# Patient Record
Sex: Male | Born: 2000 | Hispanic: No | Marital: Single | Smoking: Never smoker
Health system: Southern US, Community
[De-identification: ages and names within clinical notes are randomized; demographics above are authoritative.]

---
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
## 2020-03-08 DIAGNOSIS — J4520 Mild intermittent asthma, uncomplicated: Secondary | ICD-10-CM | POA: Diagnosis not present

## 2020-03-08 DIAGNOSIS — J309 Allergic rhinitis, unspecified: Secondary | ICD-10-CM | POA: Diagnosis not present

## 2020-03-08 DIAGNOSIS — R42 Dizziness and giddiness: Secondary | ICD-10-CM | POA: Diagnosis not present

## 2020-04-01 DIAGNOSIS — R42 Dizziness and giddiness: Secondary | ICD-10-CM | POA: Diagnosis not present

## 2020-04-02 ENCOUNTER — Other Ambulatory Visit: Payer: Self-pay | Admitting: Otolaryngology

## 2020-04-02 DIAGNOSIS — R42 Dizziness and giddiness: Secondary | ICD-10-CM

## 2020-10-01 DIAGNOSIS — M62830 Muscle spasm of back: Secondary | ICD-10-CM | POA: Diagnosis not present

## 2020-10-01 DIAGNOSIS — M25532 Pain in left wrist: Secondary | ICD-10-CM | POA: Diagnosis not present

## 2020-10-15 DIAGNOSIS — S60222A Contusion of left hand, initial encounter: Secondary | ICD-10-CM | POA: Diagnosis not present

## 2020-10-15 DIAGNOSIS — M25532 Pain in left wrist: Secondary | ICD-10-CM | POA: Diagnosis not present

## 2022-04-30 ENCOUNTER — Encounter: Payer: Self-pay | Admitting: Family Medicine

## 2022-04-30 ENCOUNTER — Ambulatory Visit (INDEPENDENT_AMBULATORY_CARE_PROVIDER_SITE_OTHER): Payer: 59 | Admitting: Family Medicine

## 2022-04-30 VITALS — BP 116/78 | HR 72 | Temp 97.0°F | Ht 69.0 in | Wt 211.0 lb

## 2022-04-30 DIAGNOSIS — Z0000 Encounter for general adult medical examination without abnormal findings: Secondary | ICD-10-CM | POA: Diagnosis not present

## 2022-04-30 DIAGNOSIS — Z5320 Procedure and treatment not carried out because of patient's decision for unspecified reasons: Secondary | ICD-10-CM

## 2022-04-30 DIAGNOSIS — Z131 Encounter for screening for diabetes mellitus: Secondary | ICD-10-CM

## 2022-04-30 DIAGNOSIS — Z13220 Encounter for screening for lipoid disorders: Secondary | ICD-10-CM | POA: Diagnosis not present

## 2022-04-30 DIAGNOSIS — Z6831 Body mass index (BMI) 31.0-31.9, adult: Secondary | ICD-10-CM | POA: Diagnosis not present

## 2022-04-30 DIAGNOSIS — Z2821 Immunization not carried out because of patient refusal: Secondary | ICD-10-CM

## 2022-04-30 DIAGNOSIS — R42 Dizziness and giddiness: Secondary | ICD-10-CM | POA: Diagnosis not present

## 2022-04-30 LAB — LIPID PANEL
Cholesterol: 142 mg/dL (ref 0–200)
HDL: 54.2 mg/dL (ref >39.00)
LDL Cholesterol: 62 mg/dL (ref 0–99)
NonHDL: 87.38
Total CHOL/HDL Ratio: 3
Triglycerides: 125 mg/dL (ref 0.0–149.0)
VLDL: 25 mg/dL (ref 0.0–40.0)

## 2022-04-30 LAB — COMPREHENSIVE METABOLIC PANEL
ALT: 31 U/L (ref 0–53)
AST: 25 U/L (ref 0–37)
Albumin: 4.7 g/dL (ref 3.5–5.2)
Alkaline Phosphatase: 77 U/L (ref 39–117)
BUN: 12 mg/dL (ref 6–23)
CO2: 31 mEq/L (ref 19–32)
Calcium: 9.8 mg/dL (ref 8.4–10.5)
Chloride: 102 mEq/L (ref 96–112)
Creatinine, Ser: 0.8 mg/dL (ref 0.40–1.50)
GFR: 126.7 mL/min (ref >60.00)
Glucose, Bld: 118 mg/dL — ABNORMAL HIGH (ref 70–99)
Potassium: 3.6 mEq/L (ref 3.5–5.1)
Sodium: 139 mEq/L (ref 135–145)
Total Bilirubin: 1 mg/dL (ref 0.2–1.2)
Total Protein: 7.5 g/dL (ref 6.0–8.3)

## 2022-04-30 LAB — CBC WITH DIFFERENTIAL/PLATELET
Basophils Absolute: 0 10*3/uL (ref 0.0–0.1)
Basophils Relative: 0.5 % (ref 0.0–3.0)
Eosinophils Absolute: 0.4 10*3/uL (ref 0.0–0.7)
Eosinophils Relative: 5.5 % — ABNORMAL HIGH (ref 0.0–5.0)
HCT: 45.4 % (ref 39.0–52.0)
Hemoglobin: 15.2 g/dL (ref 13.0–17.0)
Lymphocytes Relative: 30 % (ref 12.0–46.0)
Lymphs Abs: 2.2 10*3/uL (ref 0.7–4.0)
MCHC: 33.5 g/dL (ref 30.0–36.0)
MCV: 88.2 fl (ref 78.0–100.0)
Monocytes Absolute: 0.3 10*3/uL (ref 0.1–1.0)
Monocytes Relative: 4.5 % (ref 3.0–12.0)
Neutro Abs: 4.4 10*3/uL (ref 1.4–7.7)
Neutrophils Relative %: 59.5 % (ref 43.0–77.0)
Platelets: 265 10*3/uL (ref 150.0–400.0)
RBC: 5.15 Mil/uL (ref 4.22–5.81)
RDW: 13.1 % (ref 11.5–15.5)
WBC: 7.3 10*3/uL (ref 4.0–10.5)

## 2022-04-30 LAB — HEMOGLOBIN A1C: Hgb A1c MFr Bld: 5.3 % (ref 4.6–6.5)

## 2022-04-30 LAB — TSH: TSH: 0.82 u[IU]/mL (ref 0.35–5.50)

## 2022-04-30 LAB — B12 AND FOLATE PANEL
Folate: 14.7 ng/mL (ref >5.9)
Vitamin B-12: 274 pg/mL (ref 211–911)

## 2022-04-30 NOTE — Assessment & Plan Note (Signed)
Stable  Follow up as needed

## 2022-04-30 NOTE — Assessment & Plan Note (Signed)
Counseled on exercise and diet Discussed risk of developing future disease including heart disease, stroke, diabetes, cancer Labs as ordered for risk stratification, rule out causative agents, and assess for associated sequelae CMP, CBC, lipid, hemoglobin A1c, TSH, vitamin B-12/folate, vitamin D Follow-up pending lab results

## 2022-04-30 NOTE — Progress Notes (Signed)
Chief Complaint:  Scott Berg is a 21 y.o. male who presents today for his annual comprehensive physical exam.    Assessment/Plan:     Chronic Problems Addressed Today: Problem List Items Addressed This Visit       Other   Vertigo    Stable Follow-up as needed      BMI 31.0-31.9,adult - Primary    Counseled on exercise and diet Discussed risk of developing future disease including heart disease, stroke, diabetes, cancer Labs as ordered for risk stratification, rule out causative agents, and assess for associated sequelae CMP, CBC, lipid, hemoglobin A1c, TSH, vitamin B-12/folate, vitamin D Follow-up pending lab results      Relevant Orders   Comp Met (CMET)   Lipid Profile   Hemoglobin A1C   TSH   CBC w/Diff   B12 and Folate Panel   Vitamin D 1,25 dihydroxy   Other Visit Diagnoses     Encounter for annual physical exam       Relevant Orders   Comp Met (CMET)   Lipid Profile   Hemoglobin A1C   TSH   CBC w/Diff   B12 and Folate Panel   Vitamin D 1,25 dihydroxy   Screening, lipid       Relevant Orders   Lipid Profile   Screening for diabetes mellitus       Relevant Orders   Hemoglobin A1C      Body mass index is 31.16 kg/m. /     Preventative Healthcare: Declines hepatitis C and HIV screening  Patient Counseling(The following topics were reviewed and/or handout was given):  -Nutrition: Stressed importance of moderation in sodium/caffeine intake, saturated fat and cholesterol, caloric balance, sufficient intake of fresh fruits, vegetables, and fiber.  -Stressed the importance of regular exercise.   -Substance Abuse: Discussed cessation/primary prevention of tobacco, alcohol, or other drug use; driving or other dangerous activities under the influence; availability of treatment for abuse.   -Injury prevention: Discussed safety belts, safety helmets, smoke detector, smoking near bedding or upholstery.   -Sexuality: Discussed sexually transmitted  diseases, partner selection, use of condoms, avoidance of unintended pregnancy and contraceptive alternatives.   -Dental health: Discussed importance of regular tooth brushing, flossing, and dental visits.  -Health maintenance and immunizations reviewed. Please refer to Health maintenance section.  Return to care in 1 year for next preventative visit.     Subjective:  HPI:  He has no acute complaints today.   Lifestyle Diet: Balanced Exercise: Walks a lot at work     04/30/2022    8:57 AM  Depression screen PHQ 2/9  Decreased Interest 3  Down, Depressed, Hopeless 0  PHQ - 2 Score 3  Altered sleeping 0  Tired, decreased energy 3  Change in appetite 1  Feeling bad or failure about yourself  0  Trouble concentrating 0  Moving slowly or fidgety/restless 0  Suicidal thoughts 0  PHQ-9 Score 7    Health Maintenance Due  Topic Date Due   COVID-19 Vaccine (1) Never done       ROS: No chest pain, shortness of breath, lower extremity swelling, headache, GI complaints, otherwise all systems reviewed and are negative  PMH:  The following were reviewed and entered/updated in epic: History reviewed. No pertinent past medical history. Patient Active Problem List   Diagnosis Date Noted   Vertigo 04/30/2022   BMI 31.0-31.9,adult 04/30/2022   History reviewed. No pertinent surgical history.  Family History  Problem Relation Age of Onset  Diabetes Mother     Medications- reviewed and updated No current outpatient medications on file.   No current facility-administered medications for this visit.    Allergies-reviewed and updated No Known Allergies  Social History   Socioeconomic History   Marital status: Single    Spouse name: Not on file   Number of children: Not on file   Years of education: Not on file   Highest education level: Not on file  Occupational History   Not on file  Tobacco Use   Smoking status: Never   Smokeless tobacco: Never  Vaping Use    Vaping Use: Never used  Substance and Sexual Activity   Alcohol use: Never   Drug use: Never   Sexual activity: Not on file  Other Topics Concern   Not on file  Social History Narrative   Not on file   Social Determinants of Health   Financial Resource Strain: Not on file  Food Insecurity: Not on file  Transportation Needs: Not on file  Physical Activity: Not on file  Stress: Not on file  Social Connections: Not on file        Objective:  Physical Exam: BP 116/78 (BP Location: Left Arm, Patient Position: Sitting, Cuff Size: Large)   Pulse 72   Temp (!) 97 F (36.1 C) (Temporal)   Ht _0  (1.753 m)   Wt 211 lb (95.7 kg)   SpO2 98%   BMI 31.16 kg/m   Body mass index is 31.16 kg/m. Wt Readings from Last 3 Encounters:  04/30/22 211 lb (95.7 kg)   Gen: NAD, resting comfortably HEENT: TMs normal bilaterally. OP clear. No thyromegaly noted.  CV: RRR with no murmurs appreciated Pulm: NWOB, CTAB with no crackles, wheezes, or rhonchi GI: Normal bowel sounds present. Soft, Nontender, Nondistended. MSK: no edema, cyanosis, or clubbing noted Skin: warm, dry Neuro: CN2-12 grossly intact.  Psych: Normal affect and thought content     At today's visit, we discussed treatment options, associated risk and benefits, and engage in counseling as needed.  Additionally the following were reviewed: Past medical records, past medical and surgical history, family and social background, as well as relevant laboratory results, imaging findings, and specialty notes, where applicable.  This message was generated using dictation software, and as a result, it may contain unintentional typos or errors.  Nevertheless, extensive effort was made to accurately convey at the pertinent aspects of the patient visit.    There may have been are other unrelated non-urgent complaints, but due to the busy schedule and the amount of time already spent with him, time does not permit to address these issues at  today's visit. Another appointment may have or has been requested to review these additional issues.   Marny Lowenstein, MD, MS

## 2022-04-30 NOTE — Telephone Encounter (Signed)
Form completed and signed and returned to patient.

## 2022-04-30 NOTE — Telephone Encounter (Signed)
Printed and given to provider for signature

## 2022-05-04 LAB — VITAMIN D 1,25 DIHYDROXY
Vitamin D 1, 25 (OH)2 Total: 35 pg/mL (ref 18–72)
Vitamin D2 1, 25 (OH)2: <8 pg/mL
Vitamin D3 1, 25 (OH)2: 35 pg/mL

## 2023-02-02 DIAGNOSIS — M5386 Other specified dorsopathies, lumbar region: Secondary | ICD-10-CM | POA: Diagnosis not present

## 2023-02-02 DIAGNOSIS — M9905 Segmental and somatic dysfunction of pelvic region: Secondary | ICD-10-CM | POA: Diagnosis not present

## 2023-02-02 DIAGNOSIS — M9904 Segmental and somatic dysfunction of sacral region: Secondary | ICD-10-CM | POA: Diagnosis not present

## 2023-02-02 DIAGNOSIS — M9903 Segmental and somatic dysfunction of lumbar region: Secondary | ICD-10-CM | POA: Diagnosis not present

## 2023-02-15 DIAGNOSIS — Z0000 Encounter for general adult medical examination without abnormal findings: Secondary | ICD-10-CM | POA: Diagnosis not present

## 2023-02-15 DIAGNOSIS — Z6830 Body mass index (BMI) 30.0-30.9, adult: Secondary | ICD-10-CM | POA: Diagnosis not present

## 2023-02-15 DIAGNOSIS — R030 Elevated blood-pressure reading, without diagnosis of hypertension: Secondary | ICD-10-CM | POA: Diagnosis not present

## 2023-04-19 DIAGNOSIS — J029 Acute pharyngitis, unspecified: Secondary | ICD-10-CM | POA: Diagnosis not present

## 2023-04-19 DIAGNOSIS — R030 Elevated blood-pressure reading, without diagnosis of hypertension: Secondary | ICD-10-CM | POA: Diagnosis not present

## 2023-04-19 DIAGNOSIS — Z6831 Body mass index (BMI) 31.0-31.9, adult: Secondary | ICD-10-CM | POA: Diagnosis not present

## 2023-04-19 DIAGNOSIS — J020 Streptococcal pharyngitis: Secondary | ICD-10-CM | POA: Diagnosis not present

## 2023-05-18 DIAGNOSIS — L730 Acne keloid: Secondary | ICD-10-CM | POA: Diagnosis not present

## 2023-05-18 DIAGNOSIS — L702 Acne varioliformis: Secondary | ICD-10-CM | POA: Diagnosis not present

## 2023-05-27 DIAGNOSIS — R079 Chest pain, unspecified: Secondary | ICD-10-CM | POA: Diagnosis not present

## 2023-05-27 DIAGNOSIS — E612 Magnesium deficiency: Secondary | ICD-10-CM | POA: Diagnosis not present

## 2023-05-27 DIAGNOSIS — R030 Elevated blood-pressure reading, without diagnosis of hypertension: Secondary | ICD-10-CM | POA: Diagnosis not present

## 2023-05-27 DIAGNOSIS — R5383 Other fatigue: Secondary | ICD-10-CM | POA: Diagnosis not present

## 2023-05-27 DIAGNOSIS — E6609 Other obesity due to excess calories: Secondary | ICD-10-CM | POA: Diagnosis not present

## 2023-05-27 DIAGNOSIS — R053 Chronic cough: Secondary | ICD-10-CM | POA: Diagnosis not present

## 2023-05-27 DIAGNOSIS — Z0000 Encounter for general adult medical examination without abnormal findings: Secondary | ICD-10-CM | POA: Diagnosis not present

## 2023-05-27 DIAGNOSIS — Z6832 Body mass index (BMI) 32.0-32.9, adult: Secondary | ICD-10-CM | POA: Diagnosis not present

## 2023-05-27 DIAGNOSIS — Z1159 Encounter for screening for other viral diseases: Secondary | ICD-10-CM | POA: Diagnosis not present

## 2023-05-27 DIAGNOSIS — R040 Epistaxis: Secondary | ICD-10-CM | POA: Diagnosis not present

## 2023-05-27 DIAGNOSIS — E559 Vitamin D deficiency, unspecified: Secondary | ICD-10-CM | POA: Diagnosis not present

## 2023-06-03 DIAGNOSIS — R053 Chronic cough: Secondary | ICD-10-CM | POA: Diagnosis not present

## 2023-06-04 DIAGNOSIS — R040 Epistaxis: Secondary | ICD-10-CM | POA: Diagnosis not present

## 2023-06-04 DIAGNOSIS — R053 Chronic cough: Secondary | ICD-10-CM | POA: Diagnosis not present

## 2023-06-04 DIAGNOSIS — Z6831 Body mass index (BMI) 31.0-31.9, adult: Secondary | ICD-10-CM | POA: Diagnosis not present

## 2023-06-04 DIAGNOSIS — R0602 Shortness of breath: Secondary | ICD-10-CM | POA: Diagnosis not present

## 2023-06-04 DIAGNOSIS — E6609 Other obesity due to excess calories: Secondary | ICD-10-CM | POA: Diagnosis not present

## 2023-06-04 DIAGNOSIS — E559 Vitamin D deficiency, unspecified: Secondary | ICD-10-CM | POA: Diagnosis not present

## 2023-06-07 DIAGNOSIS — J4520 Mild intermittent asthma, uncomplicated: Secondary | ICD-10-CM | POA: Diagnosis not present

## 2023-06-07 DIAGNOSIS — U099 Post covid-19 condition, unspecified: Secondary | ICD-10-CM | POA: Diagnosis not present

## 2023-06-07 DIAGNOSIS — T7801XA Anaphylactic reaction due to peanuts, initial encounter: Secondary | ICD-10-CM | POA: Diagnosis not present

## 2023-06-07 DIAGNOSIS — R053 Chronic cough: Secondary | ICD-10-CM | POA: Diagnosis not present

## 2023-06-07 DIAGNOSIS — J45909 Unspecified asthma, uncomplicated: Secondary | ICD-10-CM | POA: Diagnosis not present

## 2023-06-14 DIAGNOSIS — Z7189 Other specified counseling: Secondary | ICD-10-CM | POA: Diagnosis not present

## 2023-06-14 DIAGNOSIS — E6609 Other obesity due to excess calories: Secondary | ICD-10-CM | POA: Diagnosis not present

## 2023-06-14 DIAGNOSIS — Z6832 Body mass index (BMI) 32.0-32.9, adult: Secondary | ICD-10-CM | POA: Diagnosis not present

## 2023-06-16 DIAGNOSIS — Z6832 Body mass index (BMI) 32.0-32.9, adult: Secondary | ICD-10-CM | POA: Diagnosis not present

## 2023-06-16 DIAGNOSIS — T7801XA Anaphylactic reaction due to peanuts, initial encounter: Secondary | ICD-10-CM | POA: Diagnosis not present

## 2023-06-16 DIAGNOSIS — E6609 Other obesity due to excess calories: Secondary | ICD-10-CM | POA: Diagnosis not present

## 2023-06-18 DIAGNOSIS — L702 Acne varioliformis: Secondary | ICD-10-CM | POA: Diagnosis not present

## 2023-06-18 DIAGNOSIS — L730 Acne keloid: Secondary | ICD-10-CM | POA: Diagnosis not present

## 2023-07-16 DIAGNOSIS — W5501XA Bitten by cat, initial encounter: Secondary | ICD-10-CM | POA: Diagnosis not present

## 2023-07-16 DIAGNOSIS — Z23 Encounter for immunization: Secondary | ICD-10-CM | POA: Diagnosis not present

## 2023-07-16 DIAGNOSIS — L03114 Cellulitis of left upper limb: Secondary | ICD-10-CM | POA: Diagnosis not present

## 2023-07-26 DIAGNOSIS — Z79899 Other long term (current) drug therapy: Secondary | ICD-10-CM | POA: Diagnosis not present

## 2023-08-27 DIAGNOSIS — L709 Acne, unspecified: Secondary | ICD-10-CM | POA: Diagnosis not present

## 2023-08-27 DIAGNOSIS — L905 Scar conditions and fibrosis of skin: Secondary | ICD-10-CM | POA: Diagnosis not present

## 2023-08-31 ENCOUNTER — Ambulatory Visit: Payer: 59 | Admitting: Nurse Practitioner

## 2023-09-23 DIAGNOSIS — L700 Acne vulgaris: Secondary | ICD-10-CM | POA: Diagnosis not present

## 2023-09-23 DIAGNOSIS — Z5181 Encounter for therapeutic drug level monitoring: Secondary | ICD-10-CM | POA: Diagnosis not present

## 2023-09-29 DIAGNOSIS — H5203 Hypermetropia, bilateral: Secondary | ICD-10-CM | POA: Diagnosis not present

## 2023-10-12 DIAGNOSIS — H5213 Myopia, bilateral: Secondary | ICD-10-CM | POA: Diagnosis not present

## 2023-10-21 DIAGNOSIS — L700 Acne vulgaris: Secondary | ICD-10-CM | POA: Diagnosis not present

## 2023-10-21 DIAGNOSIS — Z5181 Encounter for therapeutic drug level monitoring: Secondary | ICD-10-CM | POA: Diagnosis not present

## 2023-11-09 DIAGNOSIS — R051 Acute cough: Secondary | ICD-10-CM | POA: Diagnosis not present

## 2023-11-09 DIAGNOSIS — R0981 Nasal congestion: Secondary | ICD-10-CM | POA: Diagnosis not present

## 2023-11-09 DIAGNOSIS — R0989 Other specified symptoms and signs involving the circulatory and respiratory systems: Secondary | ICD-10-CM | POA: Diagnosis not present

## 2023-11-09 DIAGNOSIS — R070 Pain in throat: Secondary | ICD-10-CM | POA: Diagnosis not present

## 2023-11-25 DIAGNOSIS — Z5181 Encounter for therapeutic drug level monitoring: Secondary | ICD-10-CM | POA: Diagnosis not present

## 2023-11-25 DIAGNOSIS — L700 Acne vulgaris: Secondary | ICD-10-CM | POA: Diagnosis not present

## 2023-12-02 ENCOUNTER — Ambulatory Visit: Payer: Medicaid Other | Admitting: Gastroenterology

## 2024-06-05 DIAGNOSIS — L905 Scar conditions and fibrosis of skin: Secondary | ICD-10-CM | POA: Diagnosis not present

## 2024-07-04 DIAGNOSIS — Z0120 Encounter for dental examination and cleaning without abnormal findings: Secondary | ICD-10-CM | POA: Diagnosis not present

## 2024-07-04 DIAGNOSIS — K029 Dental caries, unspecified: Secondary | ICD-10-CM | POA: Diagnosis not present

## 2024-08-30 DIAGNOSIS — Z0120 Encounter for dental examination and cleaning without abnormal findings: Secondary | ICD-10-CM | POA: Diagnosis not present

## 2024-09-04 DIAGNOSIS — R7611 Nonspecific reaction to tuberculin skin test without active tuberculosis: Secondary | ICD-10-CM | POA: Diagnosis not present

## 2024-12-11 ENCOUNTER — Emergency Department (HOSPITAL_COMMUNITY)
Admission: EM | Admit: 2024-12-11 | Discharge: 2024-12-12 | Disposition: A | Attending: Emergency Medicine | Admitting: Emergency Medicine

## 2024-12-11 DIAGNOSIS — T7840XA Allergy, unspecified, initial encounter: Secondary | ICD-10-CM

## 2024-12-11 DIAGNOSIS — T7805XA Anaphylactic reaction due to tree nuts and seeds, initial encounter: Secondary | ICD-10-CM | POA: Insufficient documentation

## 2024-12-11 LAB — BASIC METABOLIC PANEL WITH GFR
Anion gap: 12 (ref 5–15)
BUN: 15 mg/dL (ref 6–20)
CO2: 30 mmol/L (ref 22–32)
Calcium: 9.6 mg/dL (ref 8.9–10.3)
Chloride: 97 mmol/L — ABNORMAL LOW (ref 98–111)
Creatinine, Ser: 0.91 mg/dL (ref 0.61–1.24)
GFR, Estimated: >60 mL/min
Glucose, Bld: 91 mg/dL (ref 70–99)
Potassium: 3.4 mmol/L — ABNORMAL LOW (ref 3.5–5.1)
Sodium: 139 mmol/L (ref 135–145)

## 2024-12-11 LAB — CBC WITH DIFFERENTIAL/PLATELET
Abs Immature Granulocytes: 0.03 10*3/uL (ref 0.00–0.07)
Basophils Absolute: 0.1 10*3/uL (ref 0.0–0.1)
Basophils Relative: 1 %
Eosinophils Absolute: 0.7 10*3/uL — ABNORMAL HIGH (ref 0.0–0.5)
Eosinophils Relative: 6 %
HCT: 49.3 % (ref 39.0–52.0)
Hemoglobin: 16.6 g/dL (ref 13.0–17.0)
Immature Granulocytes: 0 %
Lymphocytes Relative: 38 %
Lymphs Abs: 4.3 10*3/uL — ABNORMAL HIGH (ref 0.7–4.0)
MCH: 29.2 pg (ref 26.0–34.0)
MCHC: 33.7 g/dL (ref 30.0–36.0)
MCV: 86.8 fL (ref 80.0–100.0)
Monocytes Absolute: 0.5 10*3/uL (ref 0.1–1.0)
Monocytes Relative: 5 %
Neutro Abs: 5.8 10*3/uL (ref 1.7–7.7)
Neutrophils Relative %: 50 %
Platelets: 305 10*3/uL (ref 150–400)
RBC: 5.68 MIL/uL (ref 4.22–5.81)
RDW: 12.6 % (ref 11.5–15.5)
WBC: 11.4 10*3/uL — ABNORMAL HIGH (ref 4.0–10.5)
nRBC: 0 % (ref 0.0–0.2)

## 2024-12-11 MED ORDER — EPINEPHRINE 0.3 MG/0.3ML IJ SOAJ
INTRAMUSCULAR | Status: AC
  Administered 2024-12-11: 0.3 mg via INTRAMUSCULAR
  Filled 2024-12-11: qty 0.3

## 2024-12-11 MED ORDER — EPINEPHRINE 0.3 MG/0.3ML IJ SOAJ: 0.3000 mg | Freq: Once | INTRAMUSCULAR | Status: AC

## 2024-12-11 MED ORDER — ONDANSETRON HCL 4 MG/2ML IJ SOLN
4.0000 mg | Freq: Once | INTRAMUSCULAR | Status: AC
  Administered 2024-12-11: 4 mg via INTRAVENOUS
  Filled 2024-12-11: qty 2

## 2024-12-11 MED ORDER — EPINEPHRINE 0.3 MG/0.3ML IJ SOAJ: 0.3000 mg | INTRAMUSCULAR | 2 refills | Status: AC | PRN

## 2024-12-11 MED ORDER — METHYLPREDNISOLONE SODIUM SUCC 125 MG IJ SOLR
125.0000 mg | Freq: Once | INTRAMUSCULAR | Status: AC
  Administered 2024-12-11: 125 mg via INTRAVENOUS
  Filled 2024-12-11: qty 2

## 2024-12-11 NOTE — ED Provider Notes (Signed)
 " [place] EMERGENCY DEPARTMENT AT [facility] Provider Note   CSN: [id number] Arrival date & time: 12/11/24  2112     Patient presents with: Allergic Reaction   Scott Berg is a 24 y.o. male presenting to ED with concern for tree nut allergy.  Reports accidentally ate hazelnut about an hour ago.  Patient reports he is feeling hives and itchiness all over, vomiting, nausea, throat tightness.  He took 2 Benadryl prior to arrival.  He did not give himself any epinephrine .   HPI     Prior to Admission medications  Medication Sig Start Date End Date Taking? Authorizing Provider  EPINEPHrine  0.3 mg/0.3 mL IJ SOAJ injection Inject 0.3 mg into the muscle as needed for anaphylaxis. 12/11/24  Yes Cottie Donnice PARAS, MD    Allergies: Patient has no known allergies.    Review of Systems  Updated Vital Signs BP (!) 153/116   Pulse 86   Temp 98.2 F (36.8 C) (Oral)   Resp 19   SpO2 96%   Physical Exam Constitutional:      General: He is not in acute distress.    Comments: Dry heaving  HENT:     Head: Normocephalic and atraumatic.     Comments: Oropharynx non-erythematous.  No tonsillar swelling or exudate.  No uvular deviation.  No drooling. No brawny edema. No stridor. Voice is not muffled. Eyes:     Conjunctiva/sclera: Conjunctivae normal.     Pupils: Pupils are equal, round, and reactive to light.  Cardiovascular:     Rate and Rhythm: Normal rate and regular rhythm.  Pulmonary:     Effort: Pulmonary effort is normal. No respiratory distress.  Abdominal:     General: There is no distension.     Tenderness: There is no abdominal tenderness.  Skin:    General: Skin is warm and dry.     Comments: No visible hives  Neurological:     General: No focal deficit present.     Mental Status: He is alert. Mental status is at baseline.  Psychiatric:        Mood and Affect: Mood normal.        Behavior: Behavior normal.     (all labs ordered are listed, but  only abnormal results are displayed) Labs Reviewed  BASIC METABOLIC PANEL WITH GFR - Abnormal; Notable for the following components:      Result Value   Potassium 3.4 (*)    Chloride 97 (*)    All other components within normal limits  CBC WITH DIFFERENTIAL/PLATELET - Abnormal; Notable for the following components:   WBC 11.4 (*)    Lymphs Abs 4.3 (*)    Eosinophils Absolute 0.7 (*)    All other components within normal limits    EKG: None  Radiology: No results found.   .Critical Care  Performed by: Cottie Donnice PARAS, MD Authorized by: Cottie Donnice PARAS, MD   Critical care provider statement:    Critical care time (minutes):  30   Critical care time was exclusive of:  Separately billable procedures and treating other patients   Critical care was time spent personally by me on the following activities:  Ordering and performing treatments and interventions, ordering and review of laboratory studies, ordering and review of radiographic studies, pulse oximetry, review of old charts, examination of patient and evaluation of patient's response to treatment Comments:     Epinephrine  for anaphylaxis    Medications Ordered in the ED  methylPREDNISolone   sodium succinate (SOLU-MEDROL ) 125 mg/2 mL injection 125 mg (125 mg Intravenous Given 12/11/24 2128)  EPINEPHrine  (EPI-PEN) injection 0.3 mg (0.3 mg Intramuscular Given 12/11/24 2122)  ondansetron  (ZOFRAN ) injection 4 mg (4 mg Intravenous Given 12/11/24 2129)    Clinical Course as of 12/11/24 2356  Mon Dec 11, 2024  2237 Patient is feeling much better after his epinephrine  will be monitored for a period in the ED [MT]  2355 Remains asymptomatic, BP now 140/36mmhg in the room, okay for discharge [MT]    Clinical Course User Index [MT] Khairi Garman, Donnice PARAS, MD                                 Medical Decision Making Amount and/or Complexity of Data Reviewed Labs: ordered.  Risk Prescription drug management.   Patient is presenting  with concern for allergic reaction, possible anaphylaxis in the setting of consuming tree nuts.  Known tree nut allergy.  He has 2 system involvement reported with likely with nausea vomiting, also reporting throat tightness and itching.  I do not see evidence of airway compromise at this time or tongue or uvula swelling.  Epinephrine  will be given as well as Solu-Medrol .  Patient ready took Benadryl prior to arrival.  He is here with his wife at bedside     Final diagnoses:  Allergic reaction, initial encounter    ED Discharge Orders          Ordered    EPINEPHrine  0.3 mg/0.3 mL IJ SOAJ injection  As needed        12/11/24 2305               Cottie Donnice PARAS, MD 12/11/24 2356  "
